# Patient Record
Sex: Female | Born: 1970 | Race: White | Hispanic: No | Marital: Married | Smoking: Never smoker
Health system: Southern US, Community
[De-identification: ages and names within clinical notes are randomized; demographics above are authoritative.]

## PROBLEM LIST (undated history)

## (undated) DIAGNOSIS — I10 Essential (primary) hypertension: Secondary | ICD-10-CM

## (undated) DIAGNOSIS — R7303 Prediabetes: Secondary | ICD-10-CM

## (undated) HISTORY — PX: TONSILLECTOMY: SUR1361

---

## 1999-02-01 ENCOUNTER — Other Ambulatory Visit: Admission: RE | Admit: 1999-02-01 | Discharge: 1999-02-01 | Payer: Self-pay | Admitting: Family Medicine

## 2000-02-07 ENCOUNTER — Other Ambulatory Visit: Admission: RE | Admit: 2000-02-07 | Discharge: 2000-02-07 | Payer: Self-pay

## 2001-01-22 ENCOUNTER — Other Ambulatory Visit: Admission: RE | Admit: 2001-01-22 | Discharge: 2001-01-22 | Payer: Self-pay | Admitting: Family Medicine

## 2002-03-25 ENCOUNTER — Other Ambulatory Visit: Admission: RE | Admit: 2002-03-25 | Discharge: 2002-03-25 | Payer: Self-pay | Admitting: Family Medicine

## 2004-10-25 ENCOUNTER — Ambulatory Visit: Payer: Self-pay | Admitting: Family Medicine

## 2005-04-21 ENCOUNTER — Ambulatory Visit: Payer: Self-pay | Admitting: Family Medicine

## 2005-06-16 ENCOUNTER — Ambulatory Visit: Payer: Self-pay | Admitting: Family Medicine

## 2006-03-09 ENCOUNTER — Ambulatory Visit: Payer: Self-pay | Admitting: Family Medicine

## 2016-01-21 ENCOUNTER — Other Ambulatory Visit: Payer: Self-pay

## 2016-01-21 DIAGNOSIS — Z1231 Encounter for screening mammogram for malignant neoplasm of breast: Secondary | ICD-10-CM

## 2016-02-11 ENCOUNTER — Ambulatory Visit
Admission: RE | Admit: 2016-02-11 | Discharge: 2016-02-11 | Disposition: A | Discharge status: Home / Self Care | Payer: BLUE CROSS/BLUE SHIELD | Source: Ambulatory Visit

## 2016-02-11 DIAGNOSIS — Z1231 Encounter for screening mammogram for malignant neoplasm of breast: Secondary | ICD-10-CM

## 2017-01-16 ENCOUNTER — Other Ambulatory Visit: Payer: Self-pay | Admitting: Obstetrics and Gynecology

## 2017-01-16 DIAGNOSIS — Z1231 Encounter for screening mammogram for malignant neoplasm of breast: Secondary | ICD-10-CM

## 2017-02-27 ENCOUNTER — Ambulatory Visit
Admission: RE | Admit: 2017-02-27 | Discharge: 2017-02-27 | Disposition: A | Discharge status: Home / Self Care | Payer: No Typology Code available for payment source | Source: Ambulatory Visit | Attending: Obstetrics and Gynecology | Admitting: Obstetrics and Gynecology

## 2017-02-27 DIAGNOSIS — Z1231 Encounter for screening mammogram for malignant neoplasm of breast: Secondary | ICD-10-CM

## 2017-03-06 ENCOUNTER — Other Ambulatory Visit: Payer: Self-pay | Admitting: Obstetrics and Gynecology

## 2017-03-06 DIAGNOSIS — Z1231 Encounter for screening mammogram for malignant neoplasm of breast: Secondary | ICD-10-CM

## 2017-03-19 ENCOUNTER — Ambulatory Visit
Admission: RE | Admit: 2017-03-19 | Discharge: 2017-03-19 | Disposition: A | Discharge status: Home / Self Care | Payer: No Typology Code available for payment source | Source: Ambulatory Visit | Attending: Obstetrics and Gynecology | Admitting: Obstetrics and Gynecology

## 2017-03-19 DIAGNOSIS — Z1231 Encounter for screening mammogram for malignant neoplasm of breast: Secondary | ICD-10-CM

## 2017-03-27 ENCOUNTER — Ambulatory Visit: Payer: No Typology Code available for payment source

## 2018-04-23 ENCOUNTER — Other Ambulatory Visit: Payer: Self-pay | Admitting: Obstetrics and Gynecology

## 2018-04-23 DIAGNOSIS — Z1231 Encounter for screening mammogram for malignant neoplasm of breast: Secondary | ICD-10-CM

## 2018-05-14 ENCOUNTER — Ambulatory Visit
Admission: RE | Admit: 2018-05-14 | Discharge: 2018-05-14 | Disposition: A | Discharge status: Home / Self Care | Payer: BLUE CROSS/BLUE SHIELD | Source: Ambulatory Visit | Attending: Obstetrics and Gynecology | Admitting: Obstetrics and Gynecology

## 2018-05-14 DIAGNOSIS — Z1231 Encounter for screening mammogram for malignant neoplasm of breast: Secondary | ICD-10-CM

## 2019-04-07 IMAGING — MG DIGITAL SCREENING BILATERAL MAMMOGRAM WITH TOMO AND CAD
8 series · 9 of 24 positions shown · non-contrast
Comparison: Previous exam(s).

CLINICAL DATA: Screening.

EXAM:
DIGITAL SCREENING BILATERAL MAMMOGRAM WITH TOMO AND CAD

[L MLO synth-2D]
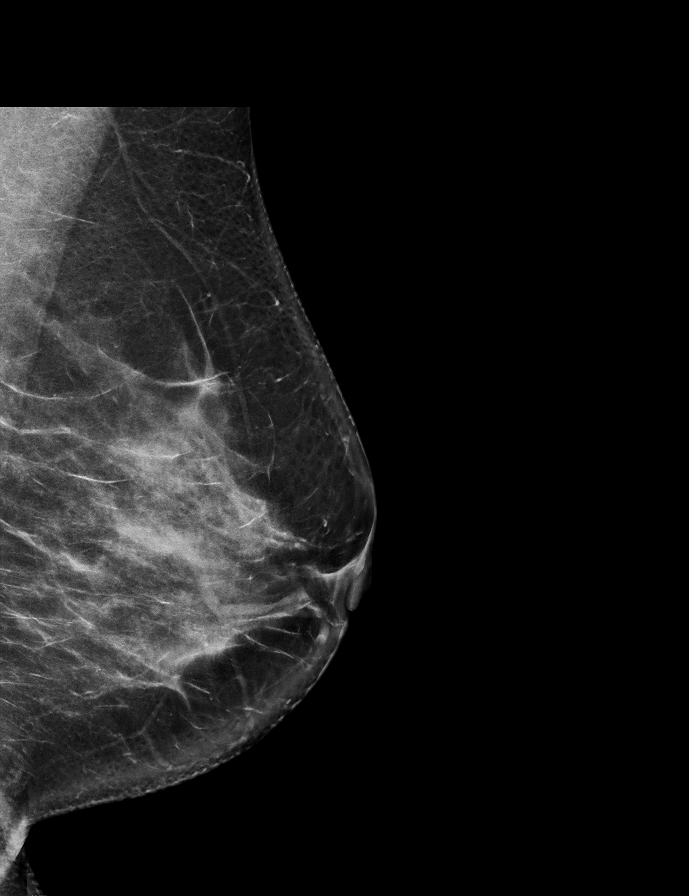

[R MLO synth-2D]
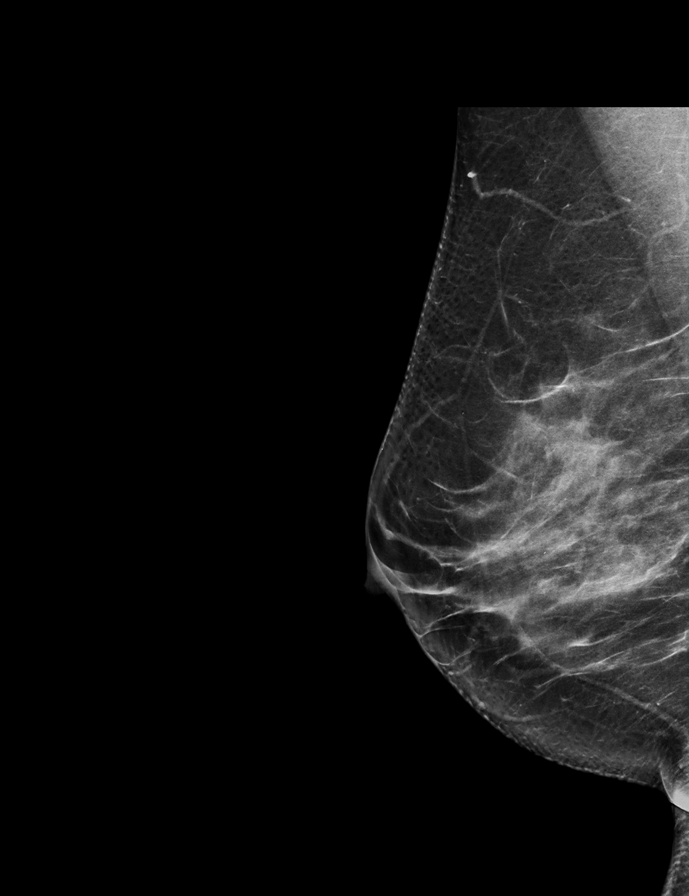

[L CC synth-2D]
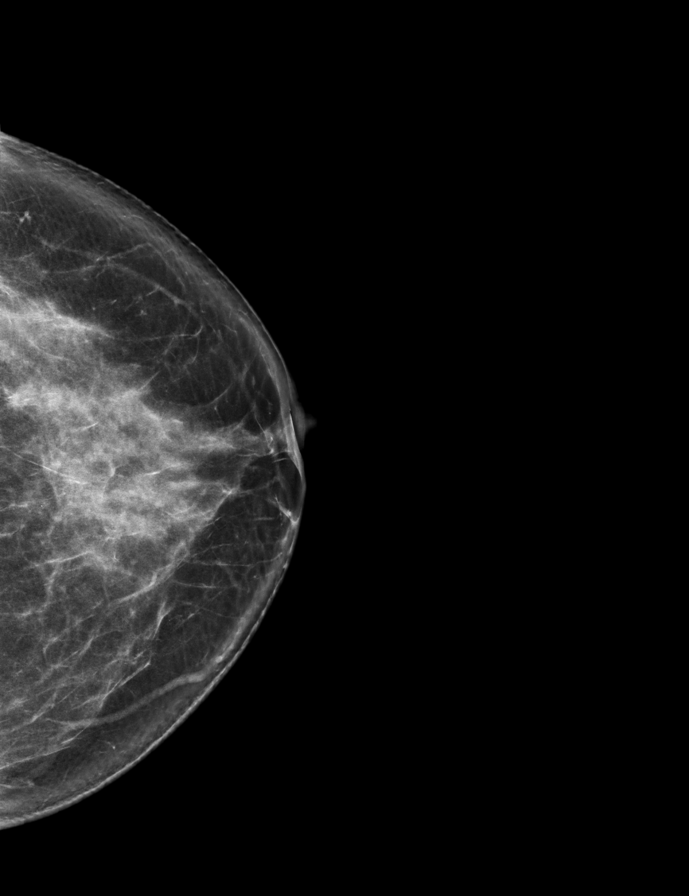

[R CC synth-2D]
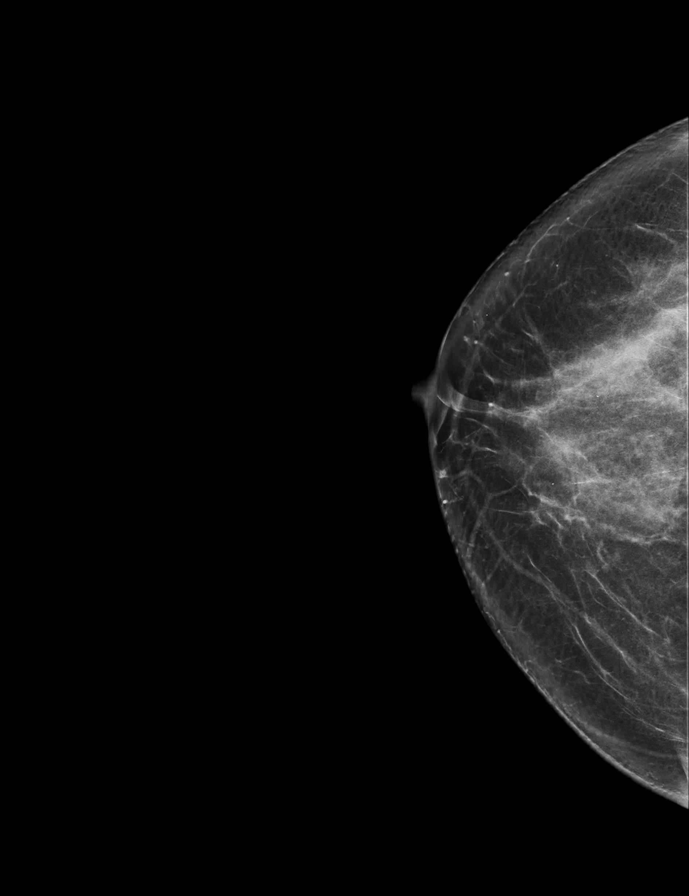

[L CC tomo · 2 of 74 frames shown]
[frame 24/74]
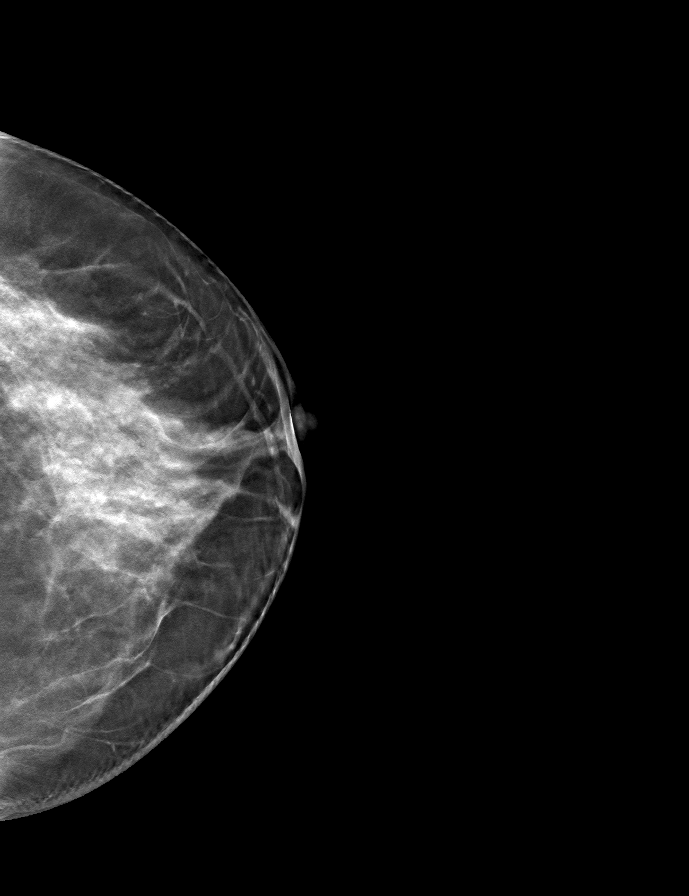
[frame 37/74]
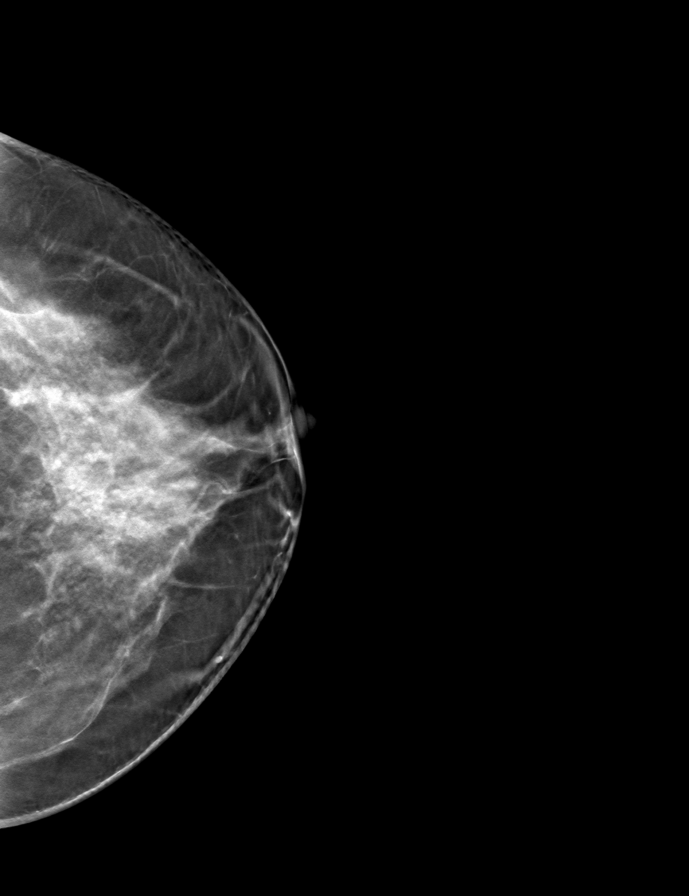

[R CC tomo · tomo slice 35/68.0]
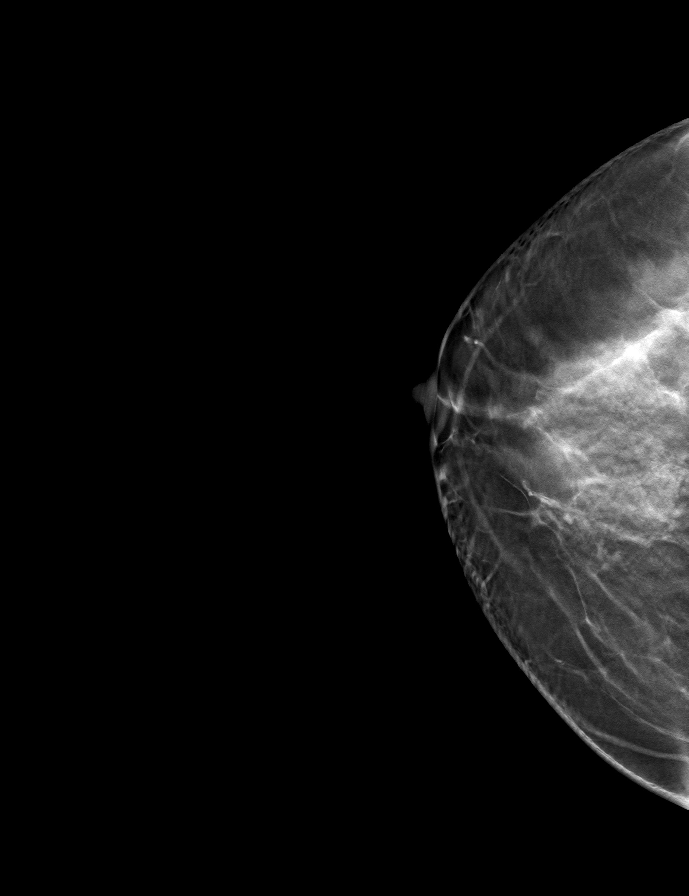

[R MLO tomo · tomo slice 37/73.0]
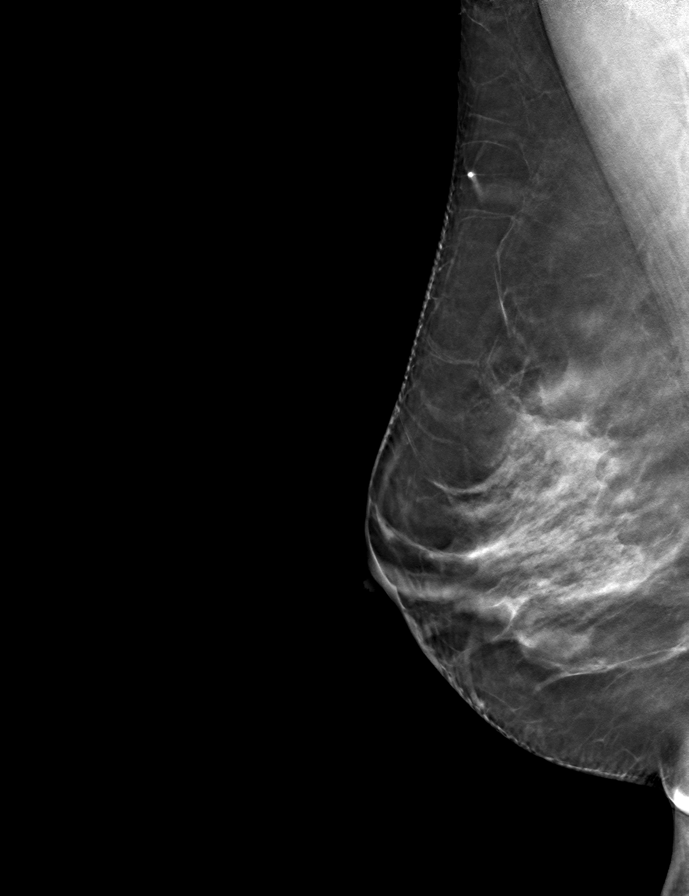

[L MLO tomo · tomo slice 40/79.0]
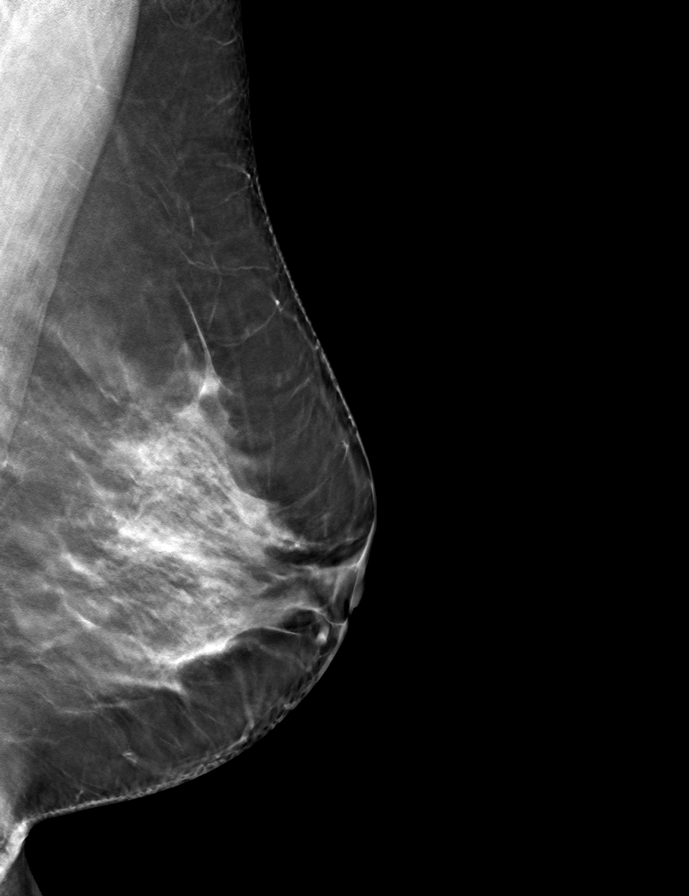

[9 of 24 positions shown; findings below may reference images not displayed]

ACR Breast Density Category c: The breast tissue is heterogeneously
dense, which may obscure small masses.
FINDINGS: There are no findings suspicious for malignancy. Images were
processed with CAD.
IMPRESSION: No mammographic evidence of malignancy. A result letter of this
screening mammogram will be mailed directly to the patient.

RECOMMENDATION:
Screening mammogram in one year. (Code:FT-U-LHB)

BI-RADS CATEGORY  1: Negative.

## 2019-07-01 ENCOUNTER — Other Ambulatory Visit: Payer: Self-pay | Admitting: Obstetrics and Gynecology

## 2019-07-01 DIAGNOSIS — Z1231 Encounter for screening mammogram for malignant neoplasm of breast: Secondary | ICD-10-CM

## 2021-08-10 ENCOUNTER — Emergency Department (HOSPITAL_BASED_OUTPATIENT_CLINIC_OR_DEPARTMENT_OTHER): Payer: 59

## 2021-08-10 ENCOUNTER — Other Ambulatory Visit: Payer: Self-pay

## 2021-08-10 ENCOUNTER — Emergency Department (HOSPITAL_BASED_OUTPATIENT_CLINIC_OR_DEPARTMENT_OTHER)
Admission: EM | Admit: 2021-08-10 | Discharge: 2021-08-10 | Disposition: A | Discharge status: Home / Self Care | Payer: 59 | Attending: Emergency Medicine | Admitting: Emergency Medicine

## 2021-08-10 ENCOUNTER — Encounter (HOSPITAL_BASED_OUTPATIENT_CLINIC_OR_DEPARTMENT_OTHER): Payer: Self-pay | Admitting: Emergency Medicine

## 2021-08-10 DIAGNOSIS — I10 Essential (primary) hypertension: Secondary | ICD-10-CM | POA: Insufficient documentation

## 2021-08-10 DIAGNOSIS — M79662 Pain in left lower leg: Secondary | ICD-10-CM | POA: Insufficient documentation

## 2021-08-10 HISTORY — DX: Prediabetes: R73.03

## 2021-08-10 HISTORY — DX: Essential (primary) hypertension: I10

## 2021-08-10 NOTE — ED Triage Notes (Signed)
Pt reports having pain in back of knee onset last night. Pt reports symptoms started as a pulling sensation in the calf area. Pt denies shortness of breath, chest pain or injury. Longest time in a car is an hour.

## 2021-08-10 NOTE — ED Provider Notes (Signed)
[facility] EMERGENCY DEPARTMENT Provider Note   CSN: [id number] Arrival date & time: 08/10/21  1235     History Chief Complaint  Patient presents with   Leg Pain    Sydney Flynn is a 50 y.o. female.   Leg Pain Associated symptoms: no back pain, no fever and no neck pain   Patient presents for pain in the back of her left leg.  Discomfort was first noted last night.  It has been intermittent.  She has not noticed any significant swelling.  She has not had difficulty with ambulation.  Pain is describes a pulling sensation in the proximal calf muscle.  Patient had some recent travel but states that she was only in her car for about an hour.  She denies any history of blood clots.  Given the location of pain, patient is worried about a DVT.  She denies any other symptoms.    Past Medical History:  Diagnosis Date   Hypertension    Prediabetes     There are no problems to display for this patient.   Past Surgical History:  Procedure Laterality Date   CESAREAN SECTION     TONSILLECTOMY       OB History   No obstetric history on file.     Family History  Problem Relation Age of Onset   Breast cancer Neg Hx     Social History   Tobacco Use   Smoking status: Never   Smokeless tobacco: Never  Vaping Use   Vaping Use: Never used  Substance Use Topics   Alcohol use: Not Currently   Drug use: Not Currently    Home Medications Prior to Admission medications   Not on File    Allergies    Rocephin [ceftriaxone] and Sulfa antibiotics  Review of Systems   Review of Systems  Constitutional:  Negative for chills and fever.  HENT:  Negative for ear pain and sore throat.   Eyes:  Negative for pain and visual disturbance.  Respiratory:  Negative for cough, chest tightness, shortness of breath and wheezing.   Cardiovascular:  Negative for chest pain, palpitations and leg swelling.  Gastrointestinal:  Negative for abdominal pain and vomiting.   Genitourinary:  Negative for dysuria and hematuria.  Musculoskeletal:  Positive for myalgias. Negative for arthralgias, back pain, gait problem, joint swelling and neck pain.  Skin:  Negative for color change and rash.  Neurological:  Negative for dizziness, seizures, syncope, weakness, light-headedness, numbness and headaches.  All other systems reviewed and are negative.  Physical Exam Updated Vital Signs BP (!) 154/87 (BP Location: Left Arm)   Pulse 75   Temp 97.9 F (36.6 C) (Oral)   Resp 18   Ht 5\' 3"  (1.6 m)   Wt 78 kg   LMP 08/04/2021 (Exact Date)   SpO2 100%   BMI 30.47 kg/m   Physical Exam Vitals and nursing note reviewed.  Constitutional:      General: She is not in acute distress.    Appearance: She is well-developed.  HENT:     Head: Normocephalic and atraumatic.  Eyes:     Conjunctiva/sclera: Conjunctivae normal.  Cardiovascular:     Rate and Rhythm: Normal rate and regular rhythm.     Heart sounds: No murmur heard. Pulmonary:     Effort: Pulmonary effort is normal. No respiratory distress.     Breath sounds: Normal breath sounds.  Abdominal:     Palpations: Abdomen is soft.  Tenderness: There is no abdominal tenderness.  Musculoskeletal:     Cervical back: Neck supple.     Comments: No masses or swelling appreciable.  Mild tenderness to palpation in left proximal calf.  No distal numbness or weakness.  Skin:    General: Skin is warm and dry.  Neurological:     General: No focal deficit present.     Mental Status: She is alert and oriented to person, place, and time.     Cranial Nerves: No cranial nerve deficit.     Sensory: No sensory deficit.     Motor: No weakness.  Psychiatric:        Mood and Affect: Mood normal.        Behavior: Behavior normal.    ED Results / Procedures / Treatments   Labs (all labs ordered are listed, but only abnormal results are displayed) Labs Reviewed - No data to display  EKG None  Radiology US Venous Img  Lower Unilateral Left  Result Date: 08/10/2021 CLINICAL DATA:  Calf pain.  Tightness. EXAM: LEFT LOWER EXTREMITY VENOUS DOPPLER ULTRASOUND TECHNIQUE: Gray-scale sonography with compression, as well as color and duplex ultrasound, were performed to evaluate the deep venous system(s) from the level of the common femoral vein through the popliteal and proximal calf veins. COMPARISON:  None. FINDINGS: VENOUS Normal compressibility of the common femoral, superficial femoral, and popliteal veins, as well as the visualized calf veins. Visualized portions of profunda femoral vein and great saphenous vein unremarkable. No filling defects to suggest DVT on grayscale or color Doppler imaging. Doppler waveforms show normal direction of venous flow, normal respiratory plasticity and response to augmentation. Limited views of the contralateral common femoral vein are unremarkable. OTHER None. Limitations: none IMPRESSION: Negative. Electronically Signed   By: Gerome Sam III M.D.   On: 08/10/2021 14:34    Procedures Procedures   Medications Ordered in ED Medications - No data to display  ED Course  I have reviewed the triage vital signs and the nursing notes.  Pertinent labs & imaging results that were available during my care of the patient were reviewed by me and considered in my medical decision making (see chart for details).    MDM Rules/Calculators/A&P                           Patient presents for concern of DVT.  She experienced proximal left calf tightness and pulling sensation starting last night.  The symptoms have been intermittent.  She has no history of VTE.  Patient well-appearing upon arrival.  Physical exam only notable for some mild tenderness in the area of concern.  DVT study was ordered.  Results of study were negative.  At this point, patient felt comfortable going home.  Patient was discharged in good condition. Final Clinical Impression(s) / ED Diagnoses Final diagnoses:  Pain of  left calf    Rx / DC Orders ED Discharge Orders     None        Gloris Manchester, MD 08/11/21 [phone]

## 2022-01-03 DIAGNOSIS — L814 Other melanin hyperpigmentation: Secondary | ICD-10-CM | POA: Diagnosis not present

## 2022-01-03 DIAGNOSIS — B351 Tinea unguium: Secondary | ICD-10-CM | POA: Diagnosis not present

## 2022-01-03 DIAGNOSIS — D225 Melanocytic nevi of trunk: Secondary | ICD-10-CM | POA: Diagnosis not present

## 2022-01-03 DIAGNOSIS — L739 Follicular disorder, unspecified: Secondary | ICD-10-CM | POA: Diagnosis not present

## 2022-02-14 DIAGNOSIS — Z1231 Encounter for screening mammogram for malignant neoplasm of breast: Secondary | ICD-10-CM | POA: Diagnosis not present

## 2022-04-23 DIAGNOSIS — R300 Dysuria: Secondary | ICD-10-CM | POA: Diagnosis not present

## 2022-04-23 DIAGNOSIS — R1030 Lower abdominal pain, unspecified: Secondary | ICD-10-CM | POA: Diagnosis not present

## 2022-08-27 DIAGNOSIS — Z1211 Encounter for screening for malignant neoplasm of colon: Secondary | ICD-10-CM | POA: Diagnosis not present

## 2022-08-27 DIAGNOSIS — K5730 Diverticulosis of large intestine without perforation or abscess without bleeding: Secondary | ICD-10-CM | POA: Diagnosis not present

## 2022-08-28 DIAGNOSIS — E663 Overweight: Secondary | ICD-10-CM | POA: Diagnosis not present

## 2022-08-28 DIAGNOSIS — E7800 Pure hypercholesterolemia, unspecified: Secondary | ICD-10-CM | POA: Diagnosis not present

## 2022-08-28 DIAGNOSIS — I10 Essential (primary) hypertension: Secondary | ICD-10-CM | POA: Diagnosis not present

## 2022-08-28 DIAGNOSIS — Z6828 Body mass index (BMI) 28.0-28.9, adult: Secondary | ICD-10-CM | POA: Diagnosis not present

## 2022-08-28 DIAGNOSIS — R7303 Prediabetes: Secondary | ICD-10-CM | POA: Diagnosis not present

## 2024-07-08 ENCOUNTER — Other Ambulatory Visit: Payer: Self-pay

## 2024-07-08 ENCOUNTER — Emergency Department (HOSPITAL_COMMUNITY)

## 2024-07-08 ENCOUNTER — Emergency Department (HOSPITAL_COMMUNITY)
Admission: EM | Admit: 2024-07-08 | Discharge: 2024-07-09 | Disposition: A | Discharge status: Home / Self Care | Source: Ambulatory Visit

## 2024-07-08 ENCOUNTER — Ambulatory Visit (HOSPITAL_BASED_OUTPATIENT_CLINIC_OR_DEPARTMENT_OTHER)
Admission: EM | Admit: 2024-07-08 | Discharge: 2024-07-08 | Disposition: A | Discharge status: Home / Self Care | Attending: Family Medicine | Admitting: Family Medicine

## 2024-07-08 ENCOUNTER — Encounter (HOSPITAL_COMMUNITY): Payer: Self-pay | Admitting: Emergency Medicine

## 2024-07-08 ENCOUNTER — Encounter (HOSPITAL_BASED_OUTPATIENT_CLINIC_OR_DEPARTMENT_OTHER): Payer: Self-pay | Admitting: Emergency Medicine

## 2024-07-08 DIAGNOSIS — R1031 Right lower quadrant pain: Secondary | ICD-10-CM | POA: Diagnosis not present

## 2024-07-08 DIAGNOSIS — K5732 Diverticulitis of large intestine without perforation or abscess without bleeding: Secondary | ICD-10-CM | POA: Diagnosis not present

## 2024-07-08 DIAGNOSIS — K5792 Diverticulitis of intestine, part unspecified, without perforation or abscess without bleeding: Secondary | ICD-10-CM

## 2024-07-08 DIAGNOSIS — R1032 Left lower quadrant pain: Secondary | ICD-10-CM | POA: Diagnosis present

## 2024-07-08 DIAGNOSIS — R1011 Right upper quadrant pain: Secondary | ICD-10-CM | POA: Diagnosis not present

## 2024-07-08 LAB — URINALYSIS, ROUTINE W REFLEX MICROSCOPIC
Bilirubin Urine: NEGATIVE
Glucose, UA: NEGATIVE mg/dL
Ketones, ur: NEGATIVE mg/dL
Leukocytes,Ua: NEGATIVE
Nitrite: NEGATIVE
Protein, ur: NEGATIVE mg/dL
Specific Gravity, Urine: 1.013 (ref 1.005–1.030)
pH: 5 (ref 5.0–8.0)

## 2024-07-08 LAB — COMPREHENSIVE METABOLIC PANEL WITH GFR
ALT: 15 U/L (ref 0–44)
AST: 20 U/L (ref 15–41)
Albumin: 4.1 g/dL (ref 3.5–5.0)
Alkaline Phosphatase: 68 U/L (ref 38–126)
Anion gap: 9 (ref 5–15)
BUN: 12 mg/dL (ref 6–20)
CO2: 24 mmol/L (ref 22–32)
Calcium: 9.3 mg/dL (ref 8.9–10.3)
Chloride: 104 mmol/L (ref 98–111)
Creatinine, Ser: 0.58 mg/dL (ref 0.44–1.00)
GFR, Estimated: >60 mL/min (ref >60)
Glucose, Bld: 114 mg/dL — ABNORMAL HIGH (ref 70–99)
Potassium: 3.8 mmol/L (ref 3.5–5.1)
Sodium: 137 mmol/L (ref 135–145)
Total Bilirubin: 0.6 mg/dL (ref 0.0–1.2)
Total Protein: 8.4 g/dL — ABNORMAL HIGH (ref 6.5–8.1)

## 2024-07-08 LAB — CBC
HCT: 39.1 % (ref 36.0–46.0)
Hemoglobin: 12.6 g/dL (ref 12.0–15.0)
MCH: 29.5 pg (ref 26.0–34.0)
MCHC: 32.2 g/dL (ref 30.0–36.0)
MCV: 91.6 fL (ref 80.0–100.0)
Platelets: 471 K/uL — ABNORMAL HIGH (ref 150–400)
RBC: 4.27 MIL/uL (ref 3.87–5.11)
RDW: 13.4 % (ref 11.5–15.5)
WBC: 7.4 K/uL (ref 4.0–10.5)
nRBC: 0 % (ref 0.0–0.2)

## 2024-07-08 LAB — LIPASE, BLOOD: Lipase: 29 U/L (ref 11–51)

## 2024-07-08 LAB — POCT URINALYSIS DIP (MANUAL ENTRY)
Bilirubin, UA: NEGATIVE
Glucose, UA: NEGATIVE mg/dL
Ketones, POC UA: NEGATIVE mg/dL
Leukocytes, UA: NEGATIVE
Nitrite, UA: NEGATIVE — AB
Protein Ur, POC: NEGATIVE mg/dL
Spec Grav, UA: 1.01 (ref 1.010–1.025)
Urobilinogen, UA: 0.2 U/dL
pH, UA: 5.5 (ref 5.0–8.0)

## 2024-07-08 MED ORDER — IOHEXOL 300 MG/ML  SOLN
100.0000 mL | Freq: Once | INTRAMUSCULAR | Status: AC | PRN
  Administered 2024-07-08: 100 mL via INTRAVENOUS

## 2024-07-08 MED ORDER — AMOXICILLIN-POT CLAVULANATE 875-125 MG PO TABS
1.0000 | ORAL_TABLET | Freq: Once | ORAL | Status: AC
  Administered 2024-07-08: 1 via ORAL
  Filled 2024-07-08: qty 1

## 2024-07-08 MED ORDER — AMOXICILLIN-POT CLAVULANATE 875-125 MG PO TABS: 1.0000 | ORAL_TABLET | Freq: Two times a day (BID) | ORAL | 0 refills | Status: AC

## 2024-07-08 MED ORDER — KETOROLAC TROMETHAMINE 15 MG/ML IJ SOLN
15.0000 mg | Freq: Once | INTRAMUSCULAR | Status: AC
  Administered 2024-07-08: 15 mg via INTRAVENOUS
  Filled 2024-07-08: qty 1

## 2024-07-08 MED ORDER — FENTANYL CITRATE PF 50 MCG/ML IJ SOSY
50.0000 ug | PREFILLED_SYRINGE | Freq: Once | INTRAMUSCULAR | Status: DC
  Filled 2024-07-08: qty 1

## 2024-07-08 MED ORDER — ONDANSETRON HCL 4 MG/2ML IJ SOLN
4.0000 mg | Freq: Once | INTRAMUSCULAR | Status: DC
  Filled 2024-07-08: qty 2

## 2024-07-08 NOTE — ED Notes (Signed)
 Patient transported to CT

## 2024-07-08 NOTE — ED Provider Notes (Signed)
 [place] EMERGENCY DEPARTMENT AT [facility] Provider Note   CSN: [id number] Arrival date & time: 07/08/24  2033     Patient presents with: Abdominal Pain   Sydney Flynn is a 53 y.o. female.   53 year old female presents for evaluation of abdominal pain.  States it started in her left lower quadrant and now is on the right side.  States she has a history of diverticulitis.  She admits to some nausea but no vomiting or diarrhea.  She states the pain is around to her right flank as well.  Denies any other symptoms or concerns at this time.   Abdominal Pain Associated symptoms: no chest pain, no chills, no cough, no dysuria, no fever, no hematuria, no shortness of breath, no sore throat and no vomiting        Prior to Admission medications   Medication Sig Start Date End Date Taking? Authorizing Provider  amoxicillin-clavulanate (AUGMENTIN) 875-125 MG tablet Take 1 tablet by mouth every 12 (twelve) hours. 07/08/24  Yes Add Dinapoli L, DO    Allergies: Rocephin [ceftriaxone] and Sulfa antibiotics    Review of Systems  Constitutional:  Negative for chills and fever.  HENT:  Negative for ear pain and sore throat.   Eyes:  Negative for pain and visual disturbance.  Respiratory:  Negative for cough and shortness of breath.   Cardiovascular:  Negative for chest pain and palpitations.  Gastrointestinal:  Positive for abdominal pain. Negative for vomiting.  Genitourinary:  Negative for dysuria and hematuria.  Musculoskeletal:  Negative for arthralgias and back pain.  Skin:  Negative for color change and rash.  Neurological:  Negative for seizures and syncope.  All other systems reviewed and are negative.   Updated Vital Signs BP 126/71   Pulse 70   Temp 98.1 F (36.7 C) (Oral)   Resp 16   Ht 5' 3 (1.6 m)   Wt 78.9 kg   LMP  (Exact Date)   SpO2 98%   BMI 30.82 kg/m   Physical Exam Vitals and nursing note reviewed.  Constitutional:      General:  She is not in acute distress.    Appearance: She is well-developed. She is not ill-appearing.  HENT:     Head: Normocephalic and atraumatic.  Eyes:     Conjunctiva/sclera: Conjunctivae normal.  Cardiovascular:     Rate and Rhythm: Normal rate and regular rhythm.     Heart sounds: No murmur heard. Pulmonary:     Effort: Pulmonary effort is normal. No respiratory distress.     Breath sounds: Normal breath sounds.  Abdominal:     General: Abdomen is flat.     Palpations: Abdomen is soft.     Tenderness: There is abdominal tenderness in the right lower quadrant.  Musculoskeletal:        General: No swelling.     Cervical back: Neck supple.  Skin:    General: Skin is warm and dry.     Capillary Refill: Capillary refill takes less than 2 seconds.  Neurological:     Mental Status: She is alert.  Psychiatric:        Mood and Affect: Mood normal.     (all labs ordered are listed, but only abnormal results are displayed) Labs Reviewed  COMPREHENSIVE METABOLIC PANEL WITH GFR - Abnormal; Notable for the following components:      Result Value   Glucose, Bld 114 (*)    Total Protein 8.4 (*)    All  other components within normal limits  CBC - Abnormal; Notable for the following components:   Platelets 471 (*)    All other components within normal limits  URINALYSIS, ROUTINE W REFLEX MICROSCOPIC - Abnormal; Notable for the following components:   APPearance HAZY (*)    Hgb urine dipstick SMALL (*)    Bacteria, UA MANY (*)    All other components within normal limits  LIPASE, BLOOD    EKG: None  Radiology: CT ABDOMEN PELVIS W CONTRAST Result Date: 07/08/2024 CLINICAL DATA:  Right lower quadrant pain EXAM: CT ABDOMEN AND PELVIS WITH CONTRAST TECHNIQUE: Multidetector CT imaging of the abdomen and pelvis was performed using the standard protocol following bolus administration of intravenous contrast. RADIATION DOSE REDUCTION: This exam was performed according to the departmental  dose-optimization program which includes automated exposure control, adjustment of the mA and/or kV according to patient size and/or use of iterative reconstruction technique. CONTRAST:  OMNIPAQUE IOHEXOL 300 MG/ML  SOLN COMPARISON:  12/01/2015 FINDINGS: Lower chest: Small hiatal hernia.  No acute findings. Hepatobiliary: No focal hepatic abnormality. Gallbladder unremarkable. Pancreas: No focal abnormality or ductal dilatation. Spleen: No focal abnormality.  Normal size. Adrenals/Urinary Tract: No adrenal abnormality. No focal renal abnormality. No stones or hydronephrosis. Urinary bladder is unremarkable. Stomach/Bowel: Appendix is visualized and is normal. There is a single diverticulum in the ascending colon with surrounding inflammation compatible with diverticulitis. No complicating feature. Moderate stool burden throughout the colon. Stomach and small bowel decompressed. Vascular/Lymphatic: No evidence of aneurysm or adenopathy. Reproductive: Uterus and adnexa unremarkable.  No mass. Other: No free fluid or free air. Musculoskeletal: No acute bony abnormality. IMPRESSION: Single diverticulum in the ascending colon with surrounding inflammation compatible with acute diverticulitis. No complicating feature. Electronically Signed   By: Franky Crease M.D.   On: 07/08/2024 23:34     Procedures   Medications Ordered in the ED  fentaNYL (SUBLIMAZE) injection 50 mcg (50 mcg Intravenous Patient Refused/Not Given 07/08/24 2155)  ondansetron (ZOFRAN) injection 4 mg (4 mg Intravenous Patient Refused/Not Given 07/08/24 2155)  amoxicillin-clavulanate (AUGMENTIN) 875-125 MG per tablet 1 tablet (has no administration in time range)  ketorolac (TORADOL) 15 MG/ML injection 15 mg (has no administration in time range)  iohexol (OMNIPAQUE) 300 MG/ML solution 100 mL (100 mLs Intravenous Contrast Given 07/08/24 2316)                                    Medical Decision Making Patient here for right lower quadrant  abdominal pain.  Labs are normal but she does have diverticulitis on her CT scan.  She has had it before.  Will start her on Augmentin.  Will give her Toradol here but she declined any other pain medications while in the ER.  Advise close follow-up with primary care doctor and given strict return precautions.  She feels comfortable with the plan to be discharged home.  Problems Addressed: Diverticulitis: acute illness or injury  Amount and/or Complexity of Data Reviewed External Data Reviewed: notes.    Details: Outpatient records reviewed and patient was sent from urgent care for CT scan and further workup and management Labs: ordered. Decision-making details documented in ED Course.    Details: Labs ordered and reviewed by me and unremarkable Radiology: ordered and independent interpretation performed. Decision-making details documented in ED Course.    Details: Imaging ordered and reviewed by me CT abdomen pelvis shows evidence of acute uncomplicated  diverticulitis  Risk OTC drugs. Prescription drug management.     Final diagnoses:  Diverticulitis    ED Discharge Orders          Ordered    amoxicillin-clavulanate (AUGMENTIN) 875-125 MG tablet  Every 12 hours        07/08/24 2346               Lekendrick Alpern L, DO 07/08/24 2352

## 2024-07-08 NOTE — ED Provider Notes (Signed)
 [facility]    CSN: [id number] Arrival date & time: 07/08/24  1748      History   Chief Complaint Chief Complaint  Patient presents with   Abdominal Pain    HPI Sydney Flynn is a 53 y.o. female.   Reports mid abdominal pain on 07/07/2024.  Today she is having right lower quadrant and right upper quadrant abdominal pain.  Pain is worse with cough or sneeze.  She has had some nausea but no vomiting.  She denies fever, diarrhea, constipation, urinary symptoms.   Abdominal Pain Associated symptoms: no chest pain, no chills, no constipation, no cough, no diarrhea, no dysuria, no fever, no hematuria, no nausea, no shortness of breath, no sore throat and no vomiting     Past Medical History:  Diagnosis Date   Hypertension    Prediabetes     There are no active problems to display for this patient.   Past Surgical History:  Procedure Laterality Date   CESAREAN SECTION     TONSILLECTOMY      OB History   No obstetric history on file.      Home Medications    Prior to Admission medications   Not on File    Family History Family History  Problem Relation Age of Onset   Breast cancer Neg Hx     Social History Social History   Tobacco Use   Smoking status: Never   Smokeless tobacco: Never  Vaping Use   Vaping status: Never Used  Substance Use Topics   Alcohol use: Not Currently   Drug use: Not Currently     Allergies   Rocephin [ceftriaxone] and Sulfa antibiotics   Review of Systems Review of Systems  Constitutional:  Negative for chills and fever.  HENT:  Negative for ear pain and sore throat.   Eyes:  Negative for pain and visual disturbance.  Respiratory:  Negative for cough and shortness of breath.   Cardiovascular:  Negative for chest pain and palpitations.  Gastrointestinal:  Positive for abdominal pain. Negative for constipation, diarrhea, nausea and vomiting.  Genitourinary:  Negative for dysuria and hematuria.   Musculoskeletal:  Negative for arthralgias and back pain.  Skin:  Negative for color change and rash.  Neurological:  Negative for seizures and syncope.  All other systems reviewed and are negative.    Physical Exam Triage Vital Signs ED Triage Vitals [07/08/24 1822]  Encounter Vitals Group     BP 133/83     Girls Systolic BP Percentile      Girls Diastolic BP Percentile      Boys Systolic BP Percentile      Boys Diastolic BP Percentile      Pulse Rate 72     Resp 16     Temp 98.5 F (36.9 C)     Temp Source Oral     SpO2 98 %     Weight      Height      Head Circumference      Peak Flow      Pain Score      Pain Loc      Pain Education      Exclude from Growth Chart    No data found.  Updated Vital Signs BP 133/83 (BP Location: Right Arm)   Pulse 72   Temp 98.5 F (36.9 C) (Oral)   Resp 16   LMP  (Exact Date)   SpO2 98%   Visual Acuity Right Eye  Distance:   Left Eye Distance:   Bilateral Distance:    Right Eye Near:   Left Eye Near:    Bilateral Near:     Physical Exam Vitals and nursing note reviewed.  Constitutional:      General: She is not in acute distress.    Appearance: She is well-developed. She is not ill-appearing, toxic-appearing or diaphoretic.  HENT:     Head: Normocephalic and atraumatic.     Right Ear: Hearing, tympanic membrane, ear canal and external ear normal.     Left Ear: Hearing, tympanic membrane, ear canal and external ear normal.     Nose: No congestion or rhinorrhea.     Right Sinus: No maxillary sinus tenderness or frontal sinus tenderness.     Left Sinus: No maxillary sinus tenderness or frontal sinus tenderness.     Mouth/Throat:     Lips: Pink.     Mouth: Mucous membranes are moist.     Pharynx: Uvula midline. No oropharyngeal exudate or posterior oropharyngeal erythema.     Tonsils: No tonsillar exudate.  Eyes:     Conjunctiva/sclera: Conjunctivae normal.     Pupils: Pupils are equal, round, and reactive to light.   Cardiovascular:     Rate and Rhythm: Normal rate and regular rhythm.     Heart sounds: S1 normal and S2 normal. No murmur heard. Pulmonary:     Effort: Pulmonary effort is normal. No respiratory distress.     Breath sounds: Normal breath sounds. No decreased breath sounds, wheezing, rhonchi or rales.  Abdominal:     General: Bowel sounds are normal.     Palpations: Abdomen is soft.     Tenderness: There is abdominal tenderness (Right upper quadrant and right lower quadrant are moderate all other abdominal pain is mild.) in the right upper quadrant, right lower quadrant, epigastric area, left upper quadrant and left lower quadrant. There is rebound (Left lower quadrant to right lower quad). There is no right CVA tenderness, left CVA tenderness or guarding. Negative signs include Murphy's sign, Rovsing's sign and McBurney's sign.  Musculoskeletal:        General: No swelling.     Cervical back: Neck supple.  Lymphadenopathy:     Head:     Right side of head: No submental, submandibular, tonsillar, preauricular or posterior auricular adenopathy.     Left side of head: No submental, submandibular, tonsillar, preauricular or posterior auricular adenopathy.     Cervical: No cervical adenopathy.     Right cervical: No superficial cervical adenopathy.    Left cervical: No superficial cervical adenopathy.  Skin:    General: Skin is warm and dry.     Capillary Refill: Capillary refill takes less than 2 seconds.     Findings: No rash.  Neurological:     Mental Status: She is alert and oriented to person, place, and time.  Psychiatric:        Mood and Affect: Mood normal.      UC Treatments / Results  Labs (all labs ordered are listed, but only abnormal results are displayed) Labs Reviewed  POCT URINALYSIS DIP (MANUAL ENTRY) - Abnormal; Notable for the following components:      Result Value   Blood, UA small (*)    Nitrite, UA Negative (*)    All other components within normal limits     EKG   Radiology No results found.  Procedures Procedures (including critical care time)  Medications Ordered in UC Medications - No data  to display  Initial Impression / Assessment and Plan / UC Course  I have reviewed the triage vital signs and the nursing notes.  Pertinent labs & imaging results that were available during my care of the patient were reviewed by me and considered in my medical decision making (see chart for details).  Plan of Care: Abdominal pain: Urinalysis shows a trace of red blood cells but is otherwise normal.  Patient has mild to moderate abdominal pain.  Offered a abdominal x-ray.  After discussion I cannot rule out appendicitis or diverticulitis with the abdominal x-ray.  She decided to go to the emergency room at [facility] or [facility] for further workup.  She is hemodynamically stable and she drove herself here and she has decided to drive herself to the emergency room in [place].  I reviewed the plan of care with the patient and/or the patient's guardian.  The patient and/or guardian had time to ask questions and acknowledged that the questions were answered.  I provided instruction on symptoms or reasons to return here or to go to an ER, if symptoms/condition did not improve, worsened or if new symptoms occurred.  Final Clinical Impressions(s) / UC Diagnoses   Final diagnoses:  RLQ abdominal pain  Right upper quadrant abdominal pain     Discharge Instructions      Patient referred to [facility] emergency room for further workup.     ED Prescriptions   None    PDMP not reviewed this encounter.   Ival Domino, FNP 07/08/24 [phone]

## 2024-07-08 NOTE — ED Triage Notes (Signed)
  Patient comes in with R sided abdominal pain that started yesterday.  Went to UC earlier today and had UA done.  Denies N/V/D.  Hx diverticulitis.  No urinary symptoms.  Denies any fevers.  No OTC meds at home.  Pain 4/10, sharp.

## 2024-07-08 NOTE — ED Triage Notes (Addendum)
 Pt st's she started having mid abd pain yesterday but today pain has moved to RLQ.  Pt st's pain worse with coughing or sneezing.  Slight nausea without vomiting  Denies diarrhea Pt denies urinary symptoms

## 2024-07-08 NOTE — Discharge Instructions (Addendum)
 Patient referred to [facility] emergency room for further workup.

## 2024-07-08 NOTE — Discharge Instructions (Addendum)
 Take your antibiotics as prescribed.  Follow-up with your primary care doctor.  Return to the ER for any new or worsening symptoms.  You can alternate Tylenol Motrin as needed for pain.

## 2024-07-08 NOTE — ED Notes (Signed)
 Patient ambulated to the bathroom.
